# Patient Record
Sex: Male | Born: 2000 | Race: White | Hispanic: Yes | Marital: Single
Health system: Southern US, Community
[De-identification: ages and names within clinical notes are randomized; demographics above are authoritative.]

---
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
## 2019-07-04 ENCOUNTER — Other Ambulatory Visit: Payer: Self-pay

## 2019-07-04 DIAGNOSIS — Z20822 Contact with and (suspected) exposure to covid-19: Secondary | ICD-10-CM

## 2019-07-06 LAB — NOVEL CORONAVIRUS, NAA: SARS-CoV-2, NAA: NOT DETECTED

## 2019-11-19 ENCOUNTER — Ambulatory Visit: Payer: Self-pay | Attending: Internal Medicine

## 2019-11-26 ENCOUNTER — Ambulatory Visit: Payer: Self-pay | Attending: Internal Medicine

## 2019-11-26 ENCOUNTER — Ambulatory Visit: Payer: Self-pay

## 2019-11-26 DIAGNOSIS — Z23 Encounter for immunization: Secondary | ICD-10-CM

## 2019-11-26 NOTE — Progress Notes (Signed)
   Covid-19 Vaccination Clinic  Name:  Guy Seese    MRN: [id number] DOB: [birth year]  11/26/2019  Mr. Schweikert was observed post Covid-19 immunization for 15 minutes without incident. He was provided with Vaccine Information Sheet and instruction to access the V-Safe system.   Mr. Christoffel was instructed to call 911 with any severe reactions post vaccine: Marland Kitchen Difficulty breathing  . Swelling of face and throat  . A fast heartbeat  . A bad rash all over body  . Dizziness and weakness   Immunizations Administered    Name Date Dose VIS Date Route   Pfizer COVID-19 Vaccine 11/26/2019  6:35 PM 0.3 mL 08/03/2019 Intramuscular   Manufacturer: [employer], [employer]   Lot: [id number]   NDC: [address]-9

## 2019-12-17 ENCOUNTER — Ambulatory Visit: Payer: Self-pay | Attending: Internal Medicine

## 2019-12-17 DIAGNOSIS — Z23 Encounter for immunization: Secondary | ICD-10-CM

## 2019-12-17 NOTE — Progress Notes (Signed)
   Covid-19 Vaccination Clinic  Name:  Brett Kane    MRN: [id number] DOB: 03/24/2001  12/17/2019  Mr. [name] was observed post Covid-19 immunization for 15 minutes without incident. He was provided with Vaccine Information Sheet and instruction to access the V-Safe system.   Mr. [name] was instructed to call 911 with any severe reactions post vaccine: Marland Kitchen Difficulty breathing  . Swelling of face and throat  . A fast heartbeat  . A bad rash all over body  . Dizziness and weakness   Immunizations Administered    Name Date Dose VIS Date Route   Pfizer COVID-19 Vaccine 12/17/2019  6:45 PM 0.3 mL 10/17/2018 Intramuscular   Manufacturer: [employer], [employer]   Lot: [id number]   NDC: [address]-6

## 2020-09-02 ENCOUNTER — Other Ambulatory Visit: Payer: Self-pay

## 2020-09-02 ENCOUNTER — Emergency Department
Admission: EM | Admit: 2020-09-02 | Discharge: 2020-09-02 | Disposition: A | Discharge status: Home / Self Care | Payer: Self-pay | Attending: Emergency Medicine | Admitting: Emergency Medicine

## 2020-09-02 DIAGNOSIS — S39012A Strain of muscle, fascia and tendon of lower back, initial encounter: Secondary | ICD-10-CM | POA: Insufficient documentation

## 2020-09-02 DIAGNOSIS — Y92410 Unspecified street and highway as the place of occurrence of the external cause: Secondary | ICD-10-CM | POA: Insufficient documentation
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
